# Patient Record
Sex: Male | Born: 1985 | Race: Black or African American | Hispanic: No | Marital: Single | Smoking: Current every day smoker
Health system: Southern US, Community
[De-identification: ages and names within clinical notes are randomized; demographics above are authoritative.]

---

## 2011-01-18 ENCOUNTER — Emergency Department: Payer: Self-pay | Admitting: Emergency Medicine

## 2018-05-21 DIAGNOSIS — Y9389 Activity, other specified: Secondary | ICD-10-CM | POA: Diagnosis not present

## 2018-05-21 DIAGNOSIS — Y999 Unspecified external cause status: Secondary | ICD-10-CM | POA: Insufficient documentation

## 2018-05-21 DIAGNOSIS — S39012A Strain of muscle, fascia and tendon of lower back, initial encounter: Secondary | ICD-10-CM | POA: Insufficient documentation

## 2018-05-21 DIAGNOSIS — F17200 Nicotine dependence, unspecified, uncomplicated: Secondary | ICD-10-CM | POA: Insufficient documentation

## 2018-05-21 DIAGNOSIS — Y92410 Unspecified street and highway as the place of occurrence of the external cause: Secondary | ICD-10-CM | POA: Insufficient documentation

## 2018-05-21 DIAGNOSIS — S3992XA Unspecified injury of lower back, initial encounter: Secondary | ICD-10-CM | POA: Diagnosis present

## 2018-05-22 ENCOUNTER — Other Ambulatory Visit: Payer: Self-pay

## 2018-05-22 ENCOUNTER — Emergency Department: Payer: Managed Care, Other (non HMO)

## 2018-05-22 ENCOUNTER — Emergency Department
Admission: EM | Admit: 2018-05-22 | Discharge: 2018-05-22 | Disposition: A | Discharge status: Home / Self Care | Payer: Managed Care, Other (non HMO) | Attending: Emergency Medicine | Admitting: Emergency Medicine

## 2018-05-22 DIAGNOSIS — S39012A Strain of muscle, fascia and tendon of lower back, initial encounter: Secondary | ICD-10-CM

## 2018-05-22 MED ORDER — IBUPROFEN 600 MG PO TABS
600.0000 mg | ORAL_TABLET | Freq: Once | ORAL | Status: AC
  Administered 2018-05-22: 600 mg via ORAL
  Filled 2018-05-22: qty 1

## 2018-05-22 NOTE — ED Notes (Signed)
Patient reports dizziness when he changes positions quickly.
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
## 2018-05-22 NOTE — Discharge Instructions (Signed)
Take ibuprofen as directed for the pain, you should expect to feel worse for couple days and then better.  If you have significant abdominal pain, numbness, weakness, signs of a significant concussion including severe headache or vomiting, numbness or weakness or you feel worse in any way return to the emergency department.  Follow closely with primary care and this department as needed.
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
## 2019-07-16 ENCOUNTER — Other Ambulatory Visit: Payer: Self-pay

## 2019-07-16 DIAGNOSIS — Z20822 Contact with and (suspected) exposure to covid-19: Secondary | ICD-10-CM

## 2019-07-17 LAB — NOVEL CORONAVIRUS, NAA: SARS-CoV-2, NAA: DETECTED — AB
